# Patient Record
Sex: Male | Born: 1995 | Hispanic: Refuse to answer | Marital: Single | Smoking: Former smoker
Health system: Southern US, Community
[De-identification: ages and names within clinical notes are randomized; demographics above are authoritative.]

---

## 2017-11-22 ENCOUNTER — Encounter: Payer: Self-pay | Admitting: Family Medicine

## 2017-11-22 ENCOUNTER — Ambulatory Visit (INDEPENDENT_AMBULATORY_CARE_PROVIDER_SITE_OTHER): Payer: BLUE CROSS/BLUE SHIELD | Admitting: Family Medicine

## 2017-11-22 VITALS — BP 159/74 | HR 83 | Temp 99.1°F | Resp 14

## 2017-11-22 DIAGNOSIS — L089 Local infection of the skin and subcutaneous tissue, unspecified: Secondary | ICD-10-CM

## 2017-11-22 DIAGNOSIS — T148XXA Other injury of unspecified body region, initial encounter: Secondary | ICD-10-CM

## 2017-11-22 MED ORDER — SULFAMETHOXAZOLE-TRIMETHOPRIM 800-160 MG PO TABS: 1.0000 | ORAL_TABLET | Freq: Two times a day (BID) | ORAL | 0 refills | Status: DC

## 2017-11-22 NOTE — Addendum Note (Signed)
Addended by: Dione HousekeeperPATEL, Shenea Giacobbe N on: 11/22/2017 01:39 PM   Modules accepted: Orders

## 2017-11-22 NOTE — Progress Notes (Signed)
Patient presents today with symptoms of infected wound to left calf area. Patient states that he lacerated the area approximately a month ago. He was put on prophylactic antibiotics at that time. He is unsure of the medication that he was placed on. He states that he did skip a few doses of the medication. He started to notice the area becoming increasingly red over the last week or so he also has noticed some foul-smelling discharge from the area. He denies any fever, chills, rash, headache. Sutures were taken out by the trainer last week.  ROS: Negative except mentioned above. Vitals as per Epic. GENERAL: NAD SKIN: Left lower leg - there is approximately a 2 inch erythematous slightly raised wound on the left mid calf area, there is scabbing over the area, mild tenderness of the area minimal serosanguineous drainage expressed, no foreign body appreciated, no streaks noted, no inguinal lymphadenopathy appreciated NEURO: CN II-XII grossly intact   A/P: Left lower leg wound - the area was cultured, patient will get back to me about which antibiotic he was placed on before, will start him on another antibiotic at this time and wait for culture results, discussed with patient that depending on the culture results may have to change antibiotic, keep area dry and clean, keep area covered when in the weight room, keep covered if draining, seek medical attention I symptoms persist or worsen as discussed.

## 2017-11-25 LAB — WOUND CULTURE

## 2017-12-09 ENCOUNTER — Encounter: Payer: Self-pay | Admitting: Family Medicine

## 2017-12-09 ENCOUNTER — Ambulatory Visit (INDEPENDENT_AMBULATORY_CARE_PROVIDER_SITE_OTHER): Payer: BLUE CROSS/BLUE SHIELD | Admitting: Family Medicine

## 2017-12-09 VITALS — Temp 98.8°F | Resp 14

## 2017-12-09 DIAGNOSIS — N5089 Other specified disorders of the male genital organs: Secondary | ICD-10-CM

## 2017-12-09 DIAGNOSIS — N509 Disorder of male genital organs, unspecified: Secondary | ICD-10-CM

## 2017-12-12 ENCOUNTER — Ambulatory Visit
Admission: RE | Admit: 2017-12-12 | Discharge: 2017-12-12 | Disposition: A | Discharge status: Home / Self Care | Payer: BLUE CROSS/BLUE SHIELD | Source: Ambulatory Visit | Attending: Family Medicine | Admitting: Family Medicine

## 2017-12-12 DIAGNOSIS — N509 Disorder of male genital organs, unspecified: Secondary | ICD-10-CM | POA: Insufficient documentation

## 2017-12-12 DIAGNOSIS — N503 Cyst of epididymis: Secondary | ICD-10-CM | POA: Insufficient documentation

## 2017-12-12 DIAGNOSIS — N5089 Other specified disorders of the male genital organs: Secondary | ICD-10-CM

## 2017-12-13 ENCOUNTER — Other Ambulatory Visit: Payer: Self-pay | Admitting: Family Medicine

## 2017-12-13 ENCOUNTER — Ambulatory Visit (INDEPENDENT_AMBULATORY_CARE_PROVIDER_SITE_OTHER): Payer: BLUE CROSS/BLUE SHIELD | Admitting: Family Medicine

## 2017-12-13 VITALS — Temp 98.2°F | Resp 14

## 2017-12-13 DIAGNOSIS — L089 Local infection of the skin and subcutaneous tissue, unspecified: Secondary | ICD-10-CM

## 2017-12-13 MED ORDER — SULFAMETHOXAZOLE-TRIMETHOPRIM 800-160 MG PO TABS: 1.0000 | ORAL_TABLET | Freq: Two times a day (BID) | ORAL | 0 refills | Status: AC

## 2017-12-17 NOTE — Progress Notes (Signed)
Patient presents today with symptoms of a lesion felt in his right testicular area. Patient states that the area is not painful. He denies any penile discharge or any genital rash. He denies any risk of STDs. He does not think that the area has gotten larger in size. He is unsure about how long it has been there for. Patient states that the wound on his left calf area is healing. There has been no drainage from the site or pain. He has finished the antibiotics that were prescribed. He has been off of the antibiotics now for 3-4 days.  ROS: Negative except mentioned above.  GENERAL: NAD RESP: CTA B CARD: RRR GU: no genital lesions, no penile discharge, small pea-sized soft area noted in the right epididymis area, slightly tender (CMA in the room for exam) SKIN: area appears to be healing on left calf, no significant erythema or tenderness of the wound on the left calf, there is no discharge or streaks noted NEURO: CN II-XII grossly intact   A/P: Right GU Mass - will get a testicular ultrasound to evaluate further, likely benign, if patient would like I can send him to Urology. Patient has declined this at this time. Seek medical attention if any further symptoms.  * continue to monitor wound on left calf, if the area starts to drain again or becomes tender patient is to follow-up with me, may need surgical referral if this happens to evaluate for I&D of the area, etc.

## 2017-12-18 ENCOUNTER — Encounter: Payer: BLUE CROSS/BLUE SHIELD | Attending: Internal Medicine | Admitting: Internal Medicine

## 2017-12-18 DIAGNOSIS — W228XXA Striking against or struck by other objects, initial encounter: Secondary | ICD-10-CM | POA: Insufficient documentation

## 2017-12-18 DIAGNOSIS — S81812A Laceration without foreign body, left lower leg, initial encounter: Secondary | ICD-10-CM | POA: Diagnosis not present

## 2017-12-25 ENCOUNTER — Ambulatory Visit: Payer: BLUE CROSS/BLUE SHIELD | Admitting: Internal Medicine

## 2017-12-25 NOTE — Progress Notes (Signed)
Patient presents today after completing the antibiotics for his left leg wound. The area has started to drain again. He states that it has been close to a week since he finished the meds. He denies any fever or chills. The wound cx did grow Staph.  ROS: Negative except mentioned above. Vitals as per Epic GENERAL: NAD SKIN: minimal serosang. Drainage from the wound on left mid calf area, minimal tenderness around site, no streaks NEURO: CN II-XII grossly intact   A/P: Wound Infection Left Leg - unsure whether just needs longer course of medication or needs area to be I&D. Will make appointment for patient to see wound care for further evaluation/treatment. Will start patient on another week long course of antibiotics. Encouraged taking probiotic as well. Keep area clean and dry. Cover if draining. Seek medical attention if any worsening symptoms.

## 2018-01-08 NOTE — Progress Notes (Signed)
RANGEL, ECHEVERRI ([id number]) Visit Report for 12/18/2017 Abuse/Suicide Risk Screen Details Patient Name: Alan Mcdaniel, Alan Mcdaniel Date of Service: 12/18/2017 9:45 AM Medical Record Number: [id number] Patient Account Number: [id number] Date of Birth/Sex: 09-03-1995 (22 y.o. M) Treating RN: Huel Coventry Primary Care Alan Mcdaniel: Alan Mcdaniel Other Clinician: Referring Romelia Bromell: Alan Mcdaniel Treating Winfred Redel/Extender: Altamese [place] in Treatment: 0 Abuse/Suicide Risk Screen Items Answer ABUSE/SUICIDE RISK SCREEN: Has anyone close to you tried to hurt or harm you recentlyo No Do you feel uncomfortable with anyone in your familyo No Has anyone forced you do things that you didnot want to doo No Do you have any thoughts of harming yourselfo No Patient displays signs or symptoms of abuse and/or neglect. No Electronic Signature(s) Signed: 12/18/2017 5:47:31 PM By: Elliot Gurney, BSN, RN, CWS, Kim RN, BSN Signed: 01/08/2018 7:45:54 AM By: Arnette Norris Entered By: Arnette Norris on 12/18/2017 10:20:31 Alan Mcdaniel ([id number]) -------------------------------------------------------------------------------- Activities of Daily Living Details Patient Name: Alan Mcdaniel Date of Service: 12/18/2017 9:45 AM Medical Record Number: [id number] Patient Account Number: [id number] Date of Birth/Sex: 1996-01-30 (22 y.o. M) Treating RN: Huel Coventry Primary Care Daveon Arpino: Alan Mcdaniel Other Clinician: Referring Keiston Manley: Alan Mcdaniel Treating Starletta Houchin/Extender: Altamese [place] in Treatment: 0 Activities of Daily Living Items Answer Activities of Daily Living (Please select one for each item) Drive Automobile Completely Able Take Medications Completely Able Use Telephone Completely Able Care for Appearance Completely Able Use Toilet Completely Able Bath / Shower Completely Able Dress Self Completely Able Feed Self Completely Able Walk Completely Able Get In / Out Bed Completely Able Housework  Completely Able Prepare Meals Completely Able Handle Money Completely Able Shop for Self Completely Able Electronic Signature(s) Signed: 12/18/2017 5:47:31 PM By: Elliot Gurney, BSN, RN, CWS, Kim RN, BSN Signed: 01/08/2018 7:45:54 AM By: Arnette Norris Entered By: Arnette Norris on 12/18/2017 10:21:09 Alan Mcdaniel ([id number]) -------------------------------------------------------------------------------- Education Assessment Details Patient Name: Alan Mcdaniel Date of Service: 12/18/2017 9:45 AM Medical Record Number: [id number] Patient Account Number: [id number] Date of Birth/Sex: Jan 28, 1996 (22 y.o. M) Treating RN: Huel Coventry Primary Care Shelise Maron: Alan Mcdaniel Other Clinician: Referring Mattisyn Cardona: Alan Mcdaniel Treating Amiri Tritch/Extender: Altamese [place] in Treatment: 0 Learning Preferences/Education Level/Primary Language Learning Preference: Explanation Highest Education Level: College or Above Preferred Language: English Cognitive Barrier Assessment/Beliefs Language Barrier: No Translator Needed: No Memory Deficit: No Emotional Barrier: No Cultural/Religious Beliefs Affecting Medical Care: No Physical Barrier Assessment Impaired Vision: No Impaired Hearing: No Decreased Hand dexterity: No Knowledge/Comprehension Assessment Knowledge Level: High Comprehension Level: High Ability to understand written High instructions: Ability to understand verbal High instructions: Motivation Assessment Anxiety Level: Calm Cooperation: Cooperative Education Importance: Acknowledges Need Interest in Health Problems: Asks Questions Perception: Coherent Willingness to Engage in Self- High Management Activities: Readiness to Engage in Self- High Management Activities: Electronic Signature(s) Signed: 12/18/2017 5:47:31 PM By: Elliot Gurney, BSN, RN, CWS, Kim RN, BSN Signed: 01/08/2018 7:45:54 AM By: Arnette Norris Entered By: Arnette Norris on 12/18/2017 10:21:40 Alan Mcdaniel  ([id number]) -------------------------------------------------------------------------------- Fall Risk Assessment Details Patient Name: Alan Mcdaniel Date of Service: 12/18/2017 9:45 AM Medical Record Number: [id number] Patient Account Number: [id number] Date of Birth/Sex: 14-Jan-1996 (22 y.o. M) Treating RN: Huel Coventry Primary Care Lubna Stegeman: Alan Mcdaniel Other Clinician: Referring Ronen Bromwell: Alan Mcdaniel Treating Mattisyn Cardona/Extender: Altamese [place] in Treatment: 0 Fall Risk Assessment Items Have you had 2 or more falls in the last 12 monthso 0 No Have you had any fall that resulted in injury in the last 12 monthso 0 No FALL RISK ASSESSMENT: History of  falling - immediate or within 3 months 0 No Secondary diagnosis 0 No Ambulatory aid None/bed rest/wheelchair/nurse 0 No Crutches/cane/walker 0 No Furniture 0 No IV Access/Saline Lock 0 No Gait/Training Normal/bed rest/immobile 0 No Weak 0 No Impaired 0 No Mental Status Oriented to own ability 0 No Electronic Signature(s) Signed: 12/18/2017 5:47:31 PM By: Elliot Gurney, BSN, RN, CWS, Kim RN, BSN Signed: 01/08/2018 7:45:54 AM By: Arnette Norris Entered By: Arnette Norris on 12/18/2017 10:21:50 Alan Mcdaniel ([id number]) -------------------------------------------------------------------------------- Foot Assessment Details Patient Name: Alan Mcdaniel Date of Service: 12/18/2017 9:45 AM Medical Record Number: [id number] Patient Account Number: [id number] Date of Birth/Sex: 1996/08/03 (22 y.o. M) Treating RN: Huel Coventry Primary Care [place] Valli: Alan Mcdaniel Other Clinician: Referring Sorrel Cassetta: Alan Mcdaniel Treating Yeshua Stryker/Extender: Altamese [place] in Treatment: 0 Foot Assessment Items Site Locations + = Sensation present, - = Sensation absent, C = Callus, U = Ulcer R = Redness, W = Warmth, M = Maceration, PU = Pre-ulcerative lesion F = Fissure, S = Swelling, D = Dryness Assessment Right: Left: Other Deformity: No  No Prior Foot Ulcer: No No Prior Amputation: No No Charcot Joint: No No Ambulatory Status: Ambulatory Without Help Gait: Steady Electronic Signature(s) Signed: 12/18/2017 5:47:31 PM By: Elliot Gurney, BSN, RN, CWS, Kim RN, BSN Signed: 01/08/2018 7:45:54 AM By: Arnette Norris Entered By: Arnette Norris on 12/18/2017 10:38:15 Alan Mcdaniel ([id number]) -------------------------------------------------------------------------------- Nutrition Risk Assessment Details Patient Name: Alan Mcdaniel Date of Service: 12/18/2017 9:45 AM Medical Record Number: [id number] Patient Account Number: [id number] Date of Birth/Sex: 09-Aug-1996 (22 y.o. M) Treating RN: Huel Coventry Primary Care Arshawn Valdez: Alan Mcdaniel Other Clinician: Referring Lindzy Rupert: Alan Mcdaniel Treating Krew Hortman/Extender: Altamese [place] in Treatment: 0 Height (in): 70 Weight (lbs): 165 Body Mass Index (BMI): 23.7 Nutrition Risk Assessment Items NUTRITION RISK SCREEN: I have an illness or condition that made me change the kind and/or amount of 0 No food I eat I eat fewer than two meals per day 0 No I eat few fruits and vegetables, or milk products 0 No I have three or more drinks of beer, liquor or wine almost every day 0 No I have tooth or mouth problems that make it hard for me to eat 0 No I don't always have enough money to buy the food I need 0 No I eat alone most of the time 0 No I take three or more different prescribed or over-the-counter drugs a day 0 No Without wanting to, I have lost or gained 10 pounds in the last six months 0 No I am not always physically able to shop, cook and/or feed myself 0 No Nutrition Protocols Good Risk Protocol Moderate Risk Protocol Electronic Signature(s) Signed: 12/18/2017 5:47:31 PM By: Elliot Gurney, BSN, RN, CWS, Kim RN, BSN Signed: 01/08/2018 7:45:54 AM By: Arnette Norris Entered By: Arnette Norris on 12/18/2017 10:22:05

## 2018-01-08 NOTE — Progress Notes (Signed)
Alan Mcdaniel, Alan Mcdaniel ([id number]) Visit Report for 12/18/2017 Debridement Details Patient Name: Alan Mcdaniel, Alan Mcdaniel Date of Service: 12/18/2017 9:45 AM Medical Record Number: [id number] Patient Account Number: [id number] Date of Birth/Sex: 13-Apr-1996 (21 y.o. M) Treating RN: Huel Coventry Primary Care Provider: Jolene Provost Other Clinician: Referring Provider: Jolene Provost Treating Provider/Extender: Altamese [place] in Treatment: 0 Debridement Performed for Wound #1 Left,Medial Lower Leg Assessment: Performed By: Physician Maxwell Caul, MD Debridement Type: Debridement Pre-procedure Verification/Time Yes - 11:15 Out Taken: Start Time: 11:15 Pain Control: Other : lidocaine 4% Total Area Debrided (L x W): 0.4 (cm) x 0.5 (cm) = 0.2 (cm) Tissue and other material Non-Viable, Eschar debrided: Level: Non-Viable Tissue Debridement Description: Selective/Open Wound Instrument: Curette Bleeding: None Hemostasis Achieved: Pressure End Time: 11:18 Procedural Pain: 0 Post Procedural Pain: 0 Response to Treatment: Procedure was tolerated well Level of Consciousness: Awake and Alert Post Procedure Vitals: Temperature: 98.3 Pulse: 67 Respiratory Rate: 16 Blood Pressure: Systolic Blood Pressure: 151 Diastolic Blood Pressure: 73 Post Debridement Measurements of Total Wound Length: (cm) 0.7 Width: (cm) 1.7 Depth: (cm) 0.1 Volume: (cm) 0.093 Character of Wound/Ulcer Post Debridement: Stable Post Procedure Diagnosis Same as Pre-procedure Electronic Signature(s) Signed: 12/18/2017 5:47:31 PM By: Elliot Gurney, BSN, RN, CWS, Kim RN, BSN Signed: 12/19/2017 1:16:01 PM By: Baltazar Najjar MD [place] ([id number]) Entered By: Baltazar Najjar on 12/18/2017 12:33:27 Alan Mcdaniel ([id number]) -------------------------------------------------------------------------------- HPI Details Patient Name: Alan Mcdaniel Date of Service: 12/18/2017 9:45 AM Medical Record Number: [id number] Patient  Account Number: [id number] Date of Birth/Sex: 1995-12-03 (21 y.o. M) Treating RN: Huel Coventry Primary Care Provider: Jolene Provost Other Clinician: Referring Provider: Jolene Provost Treating Provider/Extender: Altamese [place] in Treatment: 0 History of Present Illness HPI Description: 12/19/17; this is a otherwise healthy 22 year old man who was fishing in [place] on 11/04/17. He climbed out of the water and suffered a laceration on his left medial calf on coral that was adherent to the latter. He went to emergency. The area was sutured and antibiotics were prescribed. The sutures were removed 11 days later and there was a wound dehiscence. He is able to show me a picture of the initial wound which was really quite long and of sizable depth down and the muscle. In any case this is largely closed down. He received 2 courses of antibiotics which included doxycycline and then trimethoprim/sulfamethoxazole. He is completing the latter in 2 days' time. He saw his primary doctor recently who did a culture but did not alter the antibiotics. He does not have a wound history. He is not a diabetic and is not on any relevant medications. 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 M) Treating RN: Huel Coventry Primary Care Provider: Jolene Provost Other Clinician: Referring Provider: Jolene Provost Treating Provider/Extender: Altamese [place] in Treatment: 0 Constitutional Patient is hypertensive.. Pulse  regular and within target range for patient.Marland Kitchen Respirations regular, non-labored and within target range.. Temperature is normal and within the target range for the patient.Marland Kitchen appears in no distress. Eyes Conjunctivae clear. No discharge. Respiratory Respiratory effort is easy and symmetric bilaterally. Rate is normal at rest and on room air.. Cardiovascular Pedal  pulses palpable and strong bilaterally.. there is no edema. Lymphatic none palpable in the popliteal area bilaterally. Psychiatric No evidence of depression, anxiety, or agitation. Calm, cooperative, and communicative. Appropriate interactions and affect.. Notes wound exam; his original wound is obviously dramatically improved. Most of this is healed with adherent fresh tissue. He had a slight amount of eschar in 2 places which I removed with a #3 curet he has 2 small open areas here which I think will probably close quite easily. Electronic Signature(s) Signed: 12/19/2017 1:16:01 PM By: Baltazar Najjar MD Entered By: Baltazar Najjar on 12/18/2017 12:37:49 Alan Mcdaniel ([id number]) -------------------------------------------------------------------------------- Physician Orders Details Patient Name: Alan Mcdaniel Date of Service: 12/18/2017 9:45 AM Medical Record Number: [id number] Patient Account Number: [id number] Date of Birth/Sex: 1996-03-06 (21 y.o. M) Treating RN: Huel Coventry Primary Care Provider: Jolene Provost Other Clinician: Referring Provider: Jolene Provost Treating Provider/Extender: Altamese [place] in Treatment: 0 Verbal / Phone Orders: No Diagnosis Coding Wound Cleansing Wound #1 Left,Medial Lower Leg o Clean wound with Normal Saline. Anesthetic (add to Medication List) Wound #1 Left,Medial Lower Leg o Topical Lidocaine 4% cream applied to wound bed prior to debridement (In Clinic Only). Primary Wound Dressing Wound #1 Left,Medial Lower Leg o Silver Collagen Secondary Dressing Wound #1  Left,Medial Lower Leg o Boardered Foam Dressing Dressing Change Frequency Wound #1 Left,Medial Lower Leg o Change dressing every other day. Follow-up Appointments Wound #1 Left,Medial Lower Leg o Return Appointment in 1 week. Electronic Signature(s) Signed: 12/18/2017 5:47:31 PM By: Elliot Gurney, BSN, RN, CWS, Kim RN, BSN Signed: 12/19/2017 1:16:01 PM By: Baltazar Najjar MD Entered By: Elliot Gurney, BSN, RN, CWS, Kim on 12/18/2017 11:25:19 Alan Mcdaniel, Alan Mcdaniel ([id number]) -------------------------------------------------------------------------------- Problem List Details Patient Name: Alan Mcdaniel Date of Service: 12/18/2017 9:45 AM Medical Record Number: [id number] Patient Account Number: [id number] Date of Birth/Sex: [birth year] (21 y.o. M) Treating RN: Huel Coventry Primary Care Provider: Jolene Provost Other Clinician: Referring Provider: Jolene Provost Treating Provider/Extender: Altamese [place] in Treatment: 0 Active Problems ICD-10 Impacting Encounter Code Description Active Date Wound Healing Diagnosis S81.812D Laceration without foreign body, left lower leg, subsequent 12/18/2017 Yes encounter Inactive Problems Resolved Problems Electronic Signature(s) Signed: 12/19/2017 1:16:01 PM By: Baltazar Najjar MD Entered By: Baltazar Najjar on 12/18/2017 12:32:59 Alan Mcdaniel ([id number]) -------------------------------------------------------------------------------- Progress Note Details Patient Name: Alan Mcdaniel Date of Service: 12/18/2017 9:45 AM Medical Record Number: [id number] Patient Account Number: [id number] Date of Birth/Sex: Sep 05, 1995 (21 y.o. M) Treating RN: Huel Coventry Primary Care Provider: Jolene Provost Other Clinician: Referring Provider: Jolene Provost Treating Provider/Extender: Altamese [place] in Treatment: 0 Subjective History of Present Illness (HPI) 12/19/17; this is a otherwise healthy 22 year old man who was fishing in [place] on 11/04/17. He  climbed out of the water and suffered a laceration on his left medial calf on coral that was adherent to the latter. He went to emergency. The area was sutured and antibiotics were prescribed. The sutures were removed 11 days later and there was a wound dehiscence. He is able to show me a picture of the initial wound which was really quite long and of sizable depth down and the muscle. In any case this is largely closed down. He received 2 courses of antibiotics which included doxycycline and then trimethoprim/sulfamethoxazole. He is completing the latter in 2 days' time. He saw his primary doctor recently who did a culture but did not alter the antibiotics. He does not have a wound history. He is not a diabetic and is not on any relevant  medications. 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 Patient has been treating wound in the following manner: antibiotics, antibiotic cream. Laboratory tests have been performed in the last month. Patient reportedly has not tested positive for an antibiotic resistant organism. Patient reportedly has not tested positive for osteomyelitis. Patient reportedly has not had testing performed to evaluate circulation in the legs. Patient History Allergies no known allergies Family History Cancer - Maternal Grandparents,Paternal Grandparents, No family history of Diabetes, Heart Disease, Hereditary Spherocytosis, Hypertension, Kidney Disease, Lung Disease, Seizures, Stroke, Thyroid Problems, Tuberculosis. Social History Never smoker, Marital Status - Single, Alcohol Use - Moderate, Drug Use - No History, Caffeine Use - Daily. Review of Systems (ROS) Constitutional Symptoms (General Health) Denies complaints or symptoms of Fatigue, Fever, Chills, Marked Weight Change. Eyes Denies complaints or symptoms  of Dry Eyes, Vision Changes, Glasses / Contacts. Ear/Nose/Mouth/Throat Denies complaints or symptoms of Difficult clearing ears, Sinusitis. Hematologic/Lymphatic Denies complaints or symptoms of Bleeding / Clotting Disorders, Human Immunodeficiency Virus. Respiratory Denies complaints or symptoms of Chronic or frequent coughs, Shortness of Breath. Cardiovascular Denies complaints or symptoms of Chest pain, LE edema. Gastrointestinal Denies complaints or symptoms of Frequent diarrhea, Nausea, Vomiting. Endocrine Denies complaints or symptoms of Hepatitis, Thyroid disease, Polydypsia (Excessive Thirst). Alan Mcdaniel, Alan Mcdaniel ([id number]) Genitourinary Denies complaints or symptoms of Kidney failure/ Dialysis, Incontinence/dribbling. Immunological Denies complaints or symptoms of Hives, Itching. Integumentary (Skin) Denies complaints or symptoms of Wounds, Bleeding or bruising tendency, Breakdown, Swelling. Musculoskeletal Denies complaints or symptoms of Muscle Pain, Muscle Weakness. Neurologic Denies complaints or symptoms of Numbness/parasthesias, Focal/Weakness. Psychiatric Denies complaints or symptoms of Anxiety, Claustrophobia. Objective Constitutional Patient is hypertensive.. Pulse regular and within target range for patient.Marland Kitchen Respirations regular, non-labored and within target range.. Temperature is normal and within the target range for the patient.Marland Kitchen appears in no distress. Vitals Time Taken: 10:07 AM, Height: 70 in, Source: Stated, Weight: 165 lbs, Source: Stated, BMI: 23.7, Temperature: 98.7  F, Pulse: 104 bpm, Respiratory Rate: 16 breaths/min, Blood Pressure: 159/85 mmHg. Eyes Conjunctivae clear. No discharge. Respiratory Respiratory effort is easy and symmetric bilaterally. Rate is normal at rest and on room air.. Cardiovascular Pedal pulses palpable and strong bilaterally.. there is no edema. Lymphatic none palpable in the popliteal area bilaterally. Psychiatric No  evidence of depression, anxiety, or agitation. Calm, cooperative, and communicative. Appropriate interactions and affect.. General Notes: wound exam; his original wound is obviously dramatically improved. Most of this is healed with adherent fresh tissue. He had a slight amount of eschar in 2 places which I removed with a #3 curet he has 2 small open areas here which I think will probably close quite easily. Integumentary (Hair, Skin) Wound #1 status is Open. Original cause of wound was Trauma. The wound is located on the Left,Medial Lower Leg. The wound measures 0.4cm length x 4cm width x 0.1cm depth; 1.257cm^2 area and 0.126cm^3 volume. There is no tunneling or undermining noted. There is a none present amount of drainage noted. The wound margin is flat and intact. There is large (67-100%) pink granulation within the wound bed. There is no necrotic tissue within the wound bed. The periwound skin appearance exhibited: Dry/Scaly. The periwound skin appearance did not exhibit: Callus, Crepitus, Excoriation, Induration, Rash, Scarring, Maceration, Atrophie Blanche, Cyanosis, Ecchymosis, Hemosiderin Staining, Mottled, Pallor, Rubor, [place], Able ([id number]) Erythema. Periwound temperature was  noted as No Abnormality. Assessment Active Problems ICD-10 S81.812D - Laceration without foreign body, left lower leg, subsequent encounter Procedures Wound #1 Pre-procedure diagnosis of Wound #1 is a Trauma, Other located on the Left,Medial Lower Leg . There was a Selective/Open Wound Non-Viable Tissue Debridement with a total area of 0.2 sq cm performed by Maxwell Caul, MD. With the following instrument(s): Curette. to remove Non-Viable tissue/material Material removed includes Eschar after achieving pain control using Other (lidocaine 4%). No specimens were taken. A time out was conducted at 11:15, prior to the start of the procedure. There was no bleeding. The procedure was tolerated well with a  pain level of 0 throughout and a pain level of 0 following the procedure. Patient s Level of Consciousness post procedure was recorded as Awake and Alert and post- procedure vitals were taken including Temperature: 98.3 F, Pulse: 67 bpm, Respiratory Rate: 16 breaths/min, Blood Pressure: (151)/(73) mmHg. Post Debridement Measurements: 0.7cm length x 1.7cm width x 0.1cm depth; 0.093cm^3 volume. Character of Wound/Ulcer Post Debridement is stable. Post procedure Diagnosis Wound #1: Same as Pre-Procedure Plan Wound Cleansing: Wound #1 Left,Medial Lower Leg: Clean wound with Normal Saline. Anesthetic (add to Medication List): Wound #1 Left,Medial Lower Leg: Topical Lidocaine 4% cream applied to wound bed prior to debridement (In Clinic Only). Primary Wound Dressing: Wound #1 Left,Medial Lower Leg: Silver Collagen Secondary Dressing: Wound #1 Left,Medial Lower Leg: Boardered Foam Dressing Dressing Change Frequency: Wound #1 Left,Medial Lower Leg: Change dressing every other day. Follow-up Appointments: Wound #1 Left,Medial Lower Leg: Return Appointment in 1 week. Alan Mcdaniel, Alan Mcdaniel ([id number]) #1. Post-debridement we put collagen on this wound and border foam the patient will change this daily to every second day #2 given an appointment in a week but truthfully I think this will be healed. #3 I gather there was significant coexisting infection around this wound at one point indeed he is just finishing a course of Septra now. I see no current evidence of infection, there was nothing that needed culturing and I did not alter the antibiotics that he is finishing in the next day or 2 Electronic Signature(s) Signed: 12/19/2017 1:16:01 PM By: Baltazar Najjar MD Entered By: Baltazar Najjar on 12/18/2017 12:39:21 Alan Mcdaniel ([id number]) -------------------------------------------------------------------------------- ROS/PFSH Details Patient Name: Alan Mcdaniel Date of Service: 12/18/2017 9:45  AM Medical Record Number: [id number] Patient Account Number: [id number] Date of Birth/Sex: 06-Dec-1995 (21 y.o. M) Treating RN: Huel Coventry Primary Care Provider: Jolene Provost Other Clinician: Referring Provider: Jolene Provost Treating Provider/Extender: Altamese [place] in Treatment: 0 Wound History Do you currently have one or more open woundso Yes How many open wounds do you currently haveo 1 Approximately how long have you had your woundso 1 1/2 months How have you been treating your wound(s) until nowo antibiotics, antibiotic cream Has your wound(s) ever healed and then re-openedo No Have you had any lab work done in the past montho Yes Who ordered the lab work doneo Dr. Allena Katz Have you tested positive for an antibiotic resistant organism (MRSA, VRE)o No Have you tested positive for osteomyelitis (bone infection)o No Have you had any tests for circulation on your legso No Constitutional Symptoms (General Health) Complaints and Symptoms: Negative for: Fatigue; Fever; Chills; Marked Weight Change Eyes Complaints and Symptoms: Negative for: Dry Eyes; Vision Changes; Glasses / Contacts Ear/Nose/Mouth/Throat Complaints and Symptoms: Negative for: Difficult clearing ears; Sinusitis Hematologic/Lymphatic Complaints and Symptoms: Negative for: Bleeding / Clotting Disorders; Human Immunodeficiency Virus Respiratory Complaints and Symptoms: Negative for: Chronic or frequent  coughs; Shortness of Breath Cardiovascular Complaints and Symptoms: Negative for: Chest pain; LE edema Gastrointestinal Complaints and Symptoms: Negative for: Frequent diarrhea; Nausea; Vomiting Endocrine CEBERT, DETTMANN ([id number]) Complaints and Symptoms: Negative for: Hepatitis; Thyroid disease; Polydypsia (Excessive Thirst) Genitourinary Complaints and Symptoms: Negative for: Kidney failure/ Dialysis; Incontinence/dribbling Immunological Complaints and Symptoms: Negative for: Hives;  Itching Integumentary (Skin) Complaints and Symptoms: Negative for: Wounds; Bleeding or bruising tendency; Breakdown; Swelling Musculoskeletal Complaints and Symptoms: Negative for: Muscle Pain; Muscle Weakness Neurologic Complaints and Symptoms: Negative for: Numbness/parasthesias; Focal/Weakness Psychiatric Complaints and Symptoms: Negative for: Anxiety; Claustrophobia Immunizations Pneumococcal Vaccine: Received Pneumococcal Vaccination: No Tetanus Vaccine: Last tetanus shot: 11/02/2017 Implantable Devices Family and Social History Cancer: Yes - Maternal Grandparents,Paternal Grandparents; Diabetes: No; Heart Disease: No; Hereditary Spherocytosis: No; Hypertension: No; Kidney Disease: No; Lung Disease: No; Seizures: No; Stroke: No; Thyroid Problems: No; Tuberculosis: No; Never smoker; Marital Status - Single; Alcohol Use: Moderate; Drug Use: No History; Caffeine Use: Daily; Financial Concerns: No; Food, Clothing or Shelter Needs: No; Support System Lacking: No; Transportation Concerns: No; Advanced Directives: No; Living Will: No Electronic Signature(s) Signed: 12/18/2017 5:47:31 PM By: Elliot Gurney, BSN, RN, CWS, Kim RN, BSN Signed: 12/19/2017 1:16:01 PM By: Baltazar Najjar MD Signed: 01/08/2018 7:45:54 AM By: Arnette Norris Entered By: Arnette Norris on 12/18/2017 10:20:03 Alan Mcdaniel ([id number]) -------------------------------------------------------------------------------- SuperBill Details Patient Name: Alan Mcdaniel Date of Service: 12/18/2017 Medical Record Number: [id number] Patient Account Number: [id number] Date of Birth/Sex: 09-22-1995 (21 y.o. M) Treating RN: Huel Coventry Primary Care Provider: Jolene Provost Other Clinician: Referring Provider: Jolene Provost Treating Provider/Extender: Maxwell Caul [place] in Treatment: 0 Diagnosis Coding ICD-10 Codes Code Description [phone] Laceration without foreign body, left lower leg, subsequent encounter Facility  Procedures CPT4 Code: 13244010 Description: 99213 - WOUND CARE VISIT-LEV 3 EST PT Modifier: Quantity: 1 CPT4 Code: [id number] Description: 97597 - DEBRIDE WOUND 1ST 20 SQ CM OR < ICD-10 Diagnosis Description S81.812D Laceration without foreign body, left lower leg, subsequent Modifier: encounter Quantity: 1 Physician Procedures CPT4 Code: [id number] Description: 99202 - WC PHYS LEVEL 2 - NEW PT ICD-10 Diagnosis Description S81.812D Laceration without foreign body, left lower leg, subsequent Modifier: 25 encounter Quantity: 1 CPT4 Code: [id number] Description: 97597 - WC PHYS DEBR WO ANESTH 20 SQ CM ICD-10 Diagnosis Description S81.812D Laceration without foreign body, left lower leg, subsequent Modifier: encounter Quantity: 1 Electronic Signature(s) Signed: 12/19/2017 1:16:01 PM By: Baltazar Najjar MD Entered By: Baltazar Najjar on 12/18/2017 12:44:14

## 2018-01-08 NOTE — Progress Notes (Signed)
MARIBEL, LUIS ([id number]) Visit Report for 12/18/2017 Allergy List Details Patient Name: Alan, Mcdaniel Date of Service: 12/18/2017 9:45 AM Medical Record Number: [id number] Patient Account Number: [id number] Date of Birth/Sex: 12/05/1995 (21 y.o. M) Treating RN: Huel Coventry Primary Care Louretta Tantillo: Jolene Provost Other Clinician: Referring Addalie Calles: Jolene Provost Treating Raegan Sipp/Extender: Maxwell Caul [place] in Treatment: 0 Allergies Active Allergies no known allergies Allergy Notes Electronic Signature(s) Signed: 01/08/2018 7:45:54 AM By: Arnette Norris Entered By: Arnette Norris on 12/18/2017 10:13:50 Alan Mcdaniel ([id number]) -------------------------------------------------------------------------------- Arrival Information Details Patient Name: Alan Mcdaniel Date of Service: 12/18/2017 9:45 AM Medical Record Number: [id number] Patient Account Number: [id number] Date of Birth/Sex: [birth year] (21 y.o. M) Treating RN: Huel Coventry Primary Care Kinaya Hilliker: Jolene Provost Other Clinician: Referring Kalika Smay: Jolene Provost Treating Vianne Grieshop/Extender: Altamese [place] in Treatment: 0 Visit Information Patient Arrived: Ambulatory Arrival Time: 10:04 Accompanied By: self Transfer Assistance: None Patient Identification Verified: Yes Secondary Verification Process Completed: Yes Electronic Signature(s) Signed: 01/08/2018 7:45:54 AM By: Arnette Norris Entered By: Arnette Norris on 12/18/2017 10:06:48 Alan Mcdaniel ([id number]) -------------------------------------------------------------------------------- Clinic Level of Care Assessment Details Patient Name: Alan Mcdaniel Date of Service: 12/18/2017 9:45 AM Medical Record Number: [id number] Patient Account Number: [id number] Date of Birth/Sex: [birth year] (21 y.o. M) Treating RN: Huel Coventry Primary Care Alany Borman: Jolene Provost Other Clinician: Referring Ranald Alessio: Jolene Provost Treating Adriene Padula/Extender: Altamese [place] in Treatment: 0 Clinic Level of Care Assessment Items TOOL 1 Quantity Score  - Use when EandM and Procedure is performed on INITIAL visit 0 ASSESSMENTS - Nursing Assessment / Reassessment X - General Physical Exam (combine w/ comprehensive assessment (listed just below) when 1 20 performed on new pt. evals) X- 1 25 Comprehensive Assessment (HX, ROS, Risk Assessments, Wounds Hx, etc.) ASSESSMENTS - Wound and Skin Assessment / Reassessment  - Dermatologic / Skin Assessment (not related to wound area) 0 ASSESSMENTS - Ostomy and/or Continence Assessment and Care  - Incontinence Assessment and Management 0  - 0 Ostomy Care Assessment and Management (repouching, etc.) PROCESS - Coordination of Care X - Simple Patient / Family Education for ongoing care 1 15  - 0 Complex (extensive) Patient / Family Education for ongoing care X- 1 10 Staff obtains Chiropractor, Records, Test Results / Process Orders  - 0 Staff telephones HHA, Nursing Homes / Clarify orders / etc  - 0 Routine Transfer to another Facility (non-emergent condition)  - 0 Routine Hospital Admission (non-emergent condition)  - 0 New Admissions / Manufacturing engineer / Ordering NPWT, Apligraf, etc.  - 0 Emergency Hospital Admission (emergent condition) PROCESS - Special Needs  - Pediatric / Minor Patient Management 0  - 0 Isolation Patient Management  - 0 Hearing / Language / Visual special needs  - 0 Assessment of Community assistance (transportation, D/C planning, etc.)  - 0 Additional assistance / Altered mentation  - 0 Support Surface(s) Assessment (bed, cushion, seat, etc.) [place], Alan Mcdaniel ([id number]) INTERVENTIONS - Miscellaneous  - External ear exam 0  - 0 Patient Transfer (multiple staff / Nurse, adult / Similar devices)  - 0 Simple Staple / Suture removal (25 or less)  - 0 Complex Staple / Suture removal (26 or more)  - 0 Hypo/Hyperglycemic Management (do  not check if billed separately) X- 1 15 Ankle / Brachial Index (ABI) - do not check if billed separately Has the patient been seen at the hospital within the last three years: Yes Total Score: 85 Level Of Care: New/Established - Level 3 Electronic Signature(s) Signed: 12/18/2017 5:47:31 PM By: Elliot Gurney,  BSN, RN, CWS, Kim RN, BSN Entered By: Elliot Gurney, BSN, RN, CWS, Kim on 12/18/2017 11:26:01 Alan Mcdaniel ([id number]) -------------------------------------------------------------------------------- Encounter Discharge Information Details Patient Name: Alan Mcdaniel Date of Service: 12/18/2017 9:45 AM Medical Record Number: [id number] Patient Account Number: [id number] Date of Birth/Sex: 1996/08/12 (21 y.o. M) Treating RN: Huel Coventry Primary Care Emanuelle Hammerstrom: Jolene Provost Other Clinician: Referring Keefe Zawistowski: Jolene Provost Treating Arhianna Ebey/Extender: Altamese [place] in Treatment: 0 Encounter Discharge Information Items Discharge Condition: Stable Ambulatory Status: Ambulatory Discharge Destination: Home Transportation: Private Auto Accompanied By: self Schedule Follow-up Appointment: Yes Clinical Summary of Care: Electronic Signature(s) Signed: 01/08/2018 7:45:54 AM By: Arnette Norris Entered By: Arnette Norris on 12/18/2017 11:28:59 Alan Mcdaniel ([id number]) -------------------------------------------------------------------------------- Lower Extremity Assessment Details Patient Name: Alan Mcdaniel Date of Service: 12/18/2017 9:45 AM Medical Record Number: [id number] Patient Account Number: [id number] Date of Birth/Sex: November 05, 1995 (21 y.o. M) Treating RN: Huel Coventry Primary Care Kyrie Bun: Jolene Provost Other Clinician: Referring Lamarco Gudiel: Jolene Provost Treating Esra Frankowski/Extender: Altamese [place] in Treatment: 0 Edema Assessment Assessed: [Left: No] [Right: No] Edema: [Left: N] [Right: o] Calf Left: Right: Point of Measurement: 35 cm From Medial Instep 35.5 cm  cm Ankle Left: Right: Point of Measurement: 12 cm From Medial Instep 21 cm cm Vascular Assessment Pulses: Dorsalis Pedis Palpable: [Left:Yes] Posterior Tibial Palpable: [Left:Yes] Popliteal Palpable: [Left:Yes] Extremity colors, hair growth, and conditions: Extremity Color: [Left:Normal] Hair Growth on Extremity: [Left:Yes] Temperature of Extremity: [Left:Warm] Capillary Refill: [Left:< 3 seconds] Dependent Rubor: [Left:No] Blanched when Elevated: [Left:No] Lipodermatosclerosis: [Left:No] Blood Pressure: Brachial: [Left:130] Dorsalis Pedis: 120 [Left:Dorsalis Pedis:] Ankle: Posterior Tibial: 160 [Left:Posterior Tibial: 1.23] Toe Nail Assessment Left: Right: Thick: No Discolored: No Deformed: No Improper Length and Hygiene: No Electronic Signature(sANTONIN, Alan Mcdaniel ([id number]) Signed: 12/18/2017 5:47:31 PM By: Elliot Gurney, BSN, RN, CWS, Kim RN, BSN Signed: 01/08/2018 7:45:54 AM By: Arnette Norris Entered By: Arnette Norris on 12/18/2017 10:37:33 Alan Mcdaniel ([id number]) -------------------------------------------------------------------------------- Multi Wound Chart Details Patient Name: Alan Mcdaniel Date of Service: 12/18/2017 9:45 AM Medical Record Number: [id number] Patient Account Number: [id number] Date of Birth/Sex: 05/27/1996 (21 y.o. M) Treating RN: Huel Coventry Primary Care Laketha Leopard: Jolene Provost Other Clinician: Referring Sri Clegg: Jolene Provost Treating Kiani Wurtzel/Extender: Altamese [place] in Treatment: 0 Vital Signs Height(in): 70 Pulse(bpm): 104 Weight(lbs): 165 Blood Pressure(mmHg): 159/85 Body Mass Index(BMI): 24 Temperature(F): 98.7 Respiratory Rate 16 (breaths/min): Photos: [1:No Photos] [N/A:N/A] Wound Location: [1:Left Lower Leg - Medial] [N/A:N/A] Wounding Event: [1:Trauma] [N/A:N/A] Primary Etiology: [1:Trauma, Other] [N/A:N/A] Date Acquired: [1:11/02/2017] [N/A:N/A] Weeks of Treatment: [1:0] [N/A:N/A] Wound Status: [1:Open]  [N/A:N/A] Measurements L x W x D [1:0.4x4x0.1] [N/A:N/A] (cm) Area (cm) : [1:1.257] [N/A:N/A] Volume (cm) : [1:0.126] [N/A:N/A] % Reduction in Area: [1:0.00%] [N/A:N/A] % Reduction in Volume: [1:0.00%] [N/A:N/A] Classification: [1:Partial Thickness] [N/A:N/A] Exudate Amount: [1:None Present] [N/A:N/A] Wound Margin: [1:Flat and Intact] [N/A:N/A] Granulation Amount: [1:Large (67-100%)] [N/A:N/A] Granulation Quality: [1:Pink] [N/A:N/A] Necrotic Amount: [1:None Present (0%)] [N/A:N/A] Exposed Structures: [1:Fascia: No Fat Layer (Subcutaneous Tissue) Exposed: No Tendon: No Muscle: No Joint: No Bone: No] [N/A:N/A] Epithelialization: [1:Large (67-100%)] [N/A:N/A] Debridement: [1:Debridement - Selective/Open Wound] [N/A:N/A] Pre-procedure [1:11:15] [N/A:N/A] Verification/Time Out Taken: Pain Control: [1:Other] [N/A:N/A] Tissue Debrided: [1:Necrotic/Eschar] [N/A:N/A] Level: [1:Non-Viable Tissue] [N/A:N/A] Debridement Area (sq cm): [1:0.2] [N/A:N/A] Instrument: [1:Curette] [N/A:N/A] Bleeding: [1:None] [N/A:N/A] Hemostasis Achieved: Pressure N/A N/A Procedural Pain: 0 N/A N/A Post Procedural Pain: 0 N/A N/A Debridement Treatment Procedure was tolerated well N/A N/A Response: Post Debridement 0.7x1.7x0.1 N/A N/A Measurements L x W x D (cm) Post Debridement Volume: 0.093 N/A N/A (cm)  Periwound Skin Texture: Excoriation: No N/A N/A Induration: No Callus: No Crepitus: No Rash: No Scarring: No Periwound Skin Moisture: Dry/Scaly: Yes N/A N/A Maceration: No Periwound Skin Color: Atrophie Blanche: No N/A N/A Cyanosis: No Ecchymosis: No Erythema: No Hemosiderin Staining: No Mottled: No Pallor: No Rubor: No Temperature: No Abnormality N/A N/A Tenderness on Palpation: No N/A N/A Wound Preparation: Ulcer Cleansing: N/A N/A Rinsed/Irrigated with Saline Topical Anesthetic Applied: Other: lidocaine 4% Procedures Performed: Debridement N/A N/A Treatment Notes Wound #1 (Left,  Medial Lower Leg) 1. Cleansed with: Clean wound with Normal Saline 2. Anesthetic Topical Lidocaine 4% cream to wound bed prior to debridement 4. Dressing Applied: Prisma Ag 5. Secondary Dressing Applied Bordered Foam Dressing Electronic Signature(s) Signed: 12/19/2017 1:16:01 PM By: Baltazar Najjar MD Entered By: Baltazar Najjar on 12/18/2017 12:33:11 Alan Mcdaniel ([id number]) -------------------------------------------------------------------------------- Multi-Disciplinary Care Plan Details Patient Name: Alan Mcdaniel Date of Service: 12/18/2017 9:45 AM Medical Record Number: [id number] Patient Account Number: [id number] Date of Birth/Sex: 1996/07/03 (21 y.o. M) Treating RN: Huel Coventry Primary Care Kensley Lares: Jolene Provost Other Clinician: Referring Safiyya Stokes: Jolene Provost Treating Ingeborg Fite/Extender: Altamese [place] in Treatment: 0 Active Inactive Electronic Signature(s) Signed: 12/26/2017 3:31:59 PM By: Elliot Gurney, BSN, RN, CWS, Kim RN, BSN Previous Signature: 12/18/2017 5:47:31 PM Version By: Elliot Gurney, BSN, RN, CWS, Kim RN, BSN Entered By: Elliot Gurney, BSN, RN, CWS, Kim on 12/26/2017 15:31:57 Alan Mcdaniel ([id number]) -------------------------------------------------------------------------------- Pain Assessment Details Patient Name: Alan Mcdaniel Date of Service: 12/18/2017 9:45 AM Medical Record Number: [id number] Patient Account Number: [id number] Date of Birth/Sex: April 06, 1996 (21 y.o. M) Treating RN: Huel Coventry Primary Care Alaine Loughney: Jolene Provost Other Clinician: Referring Meklit Cotta: Jolene Provost Treating Azelia Reiger/Extender: Altamese [place] in Treatment: 0 Active Problems Location of Pain Severity and Description of Pain Patient Has Paino No Site Locations Pain Management and Medication Current Pain Management: Electronic Signature(s) Signed: 12/18/2017 5:47:31 PM By: Elliot Gurney, BSN, RN, CWS, Kim RN, BSN Signed: 01/08/2018 7:45:54 AM By: Arnette Norris Entered By:  Arnette Norris on 12/18/2017 10:07:05 Alan Mcdaniel ([id number]) -------------------------------------------------------------------------------- Patient/Caregiver Education Details Patient Name: Alan Mcdaniel Date of Service: 12/18/2017 9:45 AM Medical Record Number: [id number] Patient Account Number: [id number] Date of Birth/Gender: 1996-06-18 (21 y.o. M) Treating RN: Huel Coventry Primary Care Physician: Jolene Provost Other Clinician: Referring Physician: Jolene Provost Treating Physician/Extender: Altamese [place] in Treatment: 0 Education Assessment Education Provided To: Patient Education Topics Provided [facility]: Handouts: [facility] Methods: Explain/Verbal Responses: State content correctly Wound Debridement: Handouts: Wound Debridement Methods: Explain/Verbal Responses: State content correctly Wound/Skin Impairment: Handouts: Caring for Your Ulcer Methods: Explain/Verbal Responses: State content correctly Electronic Signature(s) Signed: 01/08/2018 7:45:54 AM By: Arnette Norris Entered By: Arnette Norris on 12/18/2017 11:29:46 Alan Mcdaniel ([id number]) -------------------------------------------------------------------------------- Wound Assessment Details Patient Name: Alan Mcdaniel Date of Service: 12/18/2017 9:45 AM Medical Record Number: [id number] Patient Account Number: [id number] Date of Birth/Sex: [birth year] (21 y.o. M) Treating RN: Huel Coventry Primary Care Valdis Bevill: Jolene Provost Other Clinician: Referring Miyani Cronic: Jolene Provost Treating Alailah Safley/Extender: Altamese [place] in Treatment: 0 Wound Status Wound Number: 1 Primary Etiology: Trauma, Other Wound Location: Left Lower Leg - Medial Wound Status: Open Wounding Event: Trauma Date Acquired: 11/02/2017 Weeks Of Treatment: 0 Clustered Wound: No Photos Photo Uploaded By: Arnette Norris on 12/18/2017 16:29:17 Wound Measurements Length: (cm)  0.4 Width: (cm) 4 Depth: (cm) 0.1 Area: (cm) 1.257 Volume: (cm) 0.126 % Reduction in Area: 0% % Reduction in Volume: 0% Epithelialization: Large (67-100%) Tunneling: No Undermining: No Wound  Description Classification: Partial Thickness Wound Margin: Flat and Intact Exudate Amount: None Present Foul Odor After Cleansing: No Wound Bed Granulation Amount: Large (67-100%) Exposed Structure Granulation Quality: Pink Fascia Exposed: No Necrotic Amount: None Present (0%) Fat Layer (Subcutaneous Tissue) Exposed: No Tendon Exposed: No Muscle Exposed: No Joint Exposed: No Bone Exposed: No Periwound Skin Texture Texture Color No Abnormalities Noted: No No Abnormalities Noted: No Alan Mcdaniel, Alan Mcdaniel ([id number]) Callus: No Atrophie Blanche: No Crepitus: No Cyanosis: No Excoriation: No Ecchymosis: No Induration: No Erythema: No Rash: No Hemosiderin Staining: No Scarring: No Mottled: No Pallor: No Moisture Rubor: No No Abnormalities Noted: No Dry / Scaly: Yes Temperature / Pain Maceration: No Temperature: No Abnormality Wound Preparation Ulcer Cleansing: Rinsed/Irrigated with Saline Topical Anesthetic Applied: Other: lidocaine 4%, Electronic Signature(s) Signed: 12/18/2017 5:47:31 PM By: Elliot Gurney, BSN, RN, CWS, Kim RN, BSN Signed: 01/08/2018 7:45:54 AM By: Arnette Norris Entered By: Arnette Norris on 12/18/2017 10:28:40 Alan Mcdaniel ([id number]) -------------------------------------------------------------------------------- Vitals Details Patient Name: Alan Mcdaniel Date of Service: 12/18/2017 9:45 AM Medical Record Number: [id number] Patient Account Number: [id number] Date of Birth/Sex: Dec 19, 1995 (21 y.o. M) Treating RN: Huel Coventry Primary Care Romell Wolden: Jolene Provost Other Clinician: Referring Laneah Luft: Jolene Provost Treating Aili Casillas/Extender: Altamese [place] in Treatment: 0 Vital Signs Time Taken: 10:07 Temperature (F): 98.7 Height (in): 70 Pulse  (bpm): 104 Source: Stated Respiratory Rate (breaths/min): 16 Weight (lbs): 165 Blood Pressure (mmHg): 159/85 Source: Stated Reference Range: 80 - 120 mg / dl Body Mass Index (BMI): 23.7 Electronic Signature(s) Signed: 01/08/2018 7:45:54 AM By: Arnette Norris Entered By: Arnette Norris on 12/18/2017 10:09:33

## 2018-08-21 ENCOUNTER — Ambulatory Visit (INDEPENDENT_AMBULATORY_CARE_PROVIDER_SITE_OTHER): Payer: BLUE CROSS/BLUE SHIELD | Admitting: Family Medicine

## 2018-08-21 ENCOUNTER — Encounter: Payer: Self-pay | Admitting: Family Medicine

## 2018-08-21 VITALS — BP 139/78 | HR 117 | Temp 101.5°F | Resp 14

## 2018-08-21 DIAGNOSIS — R509 Fever, unspecified: Secondary | ICD-10-CM

## 2018-08-21 DIAGNOSIS — J101 Influenza due to other identified influenza virus with other respiratory manifestations: Secondary | ICD-10-CM

## 2018-08-21 DIAGNOSIS — M25551 Pain in right hip: Secondary | ICD-10-CM

## 2018-08-21 DIAGNOSIS — M79661 Pain in right lower leg: Secondary | ICD-10-CM

## 2018-08-21 LAB — POCT INFLUENZA A/B
Influenza A, POC: POSITIVE — AB
Influenza B, POC: NEGATIVE

## 2018-08-21 LAB — POCT RAPID STREP A (OFFICE): RAPID STREP A SCREEN: NEGATIVE

## 2018-08-21 MED ORDER — OSELTAMIVIR PHOSPHATE 75 MG PO CAPS: 75.0000 mg | ORAL_CAPSULE | Freq: Two times a day (BID) | ORAL | 0 refills | Status: AC

## 2018-08-22 NOTE — Progress Notes (Signed)
Patient presents today with symptoms of fever, headache, cough, nasal congestion.  Patient states that he has had symptoms for the last 2 days.  He did do athletic activity yesterday.  He denies any chest pain, shortness of breath, nausea, vomiting, diarrhea, severe headache, photophobia, neck stiffness.  Patient denies getting flu vaccination this year.  He has been taking Tylenol/ibuprofen intermittently. Patient also admits that he has had right hip pain and bilateral shin pain (R>L) for few months.  He has gotten treatments for shinsplints through the athletic training staff.  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  He denies any pain at rest or while sleeping.  He typically wears running shoes or cleats when running.  He denies any history of stress reaction or fracture in the past. Patient states his right anterior hip pain started a few months ago.  He denies any trauma or injury.  He notices the pain mostly when kicking the football and some running.  He denies any pain with walking or at rest or when sleeping.  ROS: Negative except mentioned above. Vitals as per Epic. GENERAL: NAD HEENT: mild pharyngeal erythema, no exudate, no erythema of TMs, no cervical LAD RESP: CTA B CARD: RRR MSK: Bilateral Lower Extremities - no obvious deformity, mild generalized tenderness along the mid to distal medial border of the tibia (R>L), full range of motion, normal gait, N/V intact NEURO: CN II-XII grossly intact   A/P: Influenza A - patient had positive flu test in the office today, discussed Tamiflu if patient would like to take it, rest, hydration, Delsym as needed, Tylenol/Ibuprofen as needed, seek medical attention if symptoms persist or worsen as discussed.  No athletic activity or class today or tomorrow. If patient remains afebrile over the weekend can gradually return back to physical activity on Monday. Discussed above with athletic trainer.  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  Seek medical attention for any acute problems.  Above was discussed with athletic trainer.  Right Hip Pain - unable to assess today due to limited time, will return next week for further evaluation of this, will check vitamin D level at that time, will get x-ray of right hip, activity as tolerated for now.

## 2018-08-25 ENCOUNTER — Ambulatory Visit
Admission: RE | Admit: 2018-08-25 | Discharge: 2018-08-25 | Disposition: A | Discharge status: Home / Self Care | Payer: BLUE CROSS/BLUE SHIELD | Attending: Family Medicine | Admitting: Family Medicine

## 2018-08-25 ENCOUNTER — Ambulatory Visit
Admission: RE | Admit: 2018-08-25 | Discharge: 2018-08-25 | Disposition: A | Discharge status: Home / Self Care | Payer: BLUE CROSS/BLUE SHIELD | Source: Ambulatory Visit | Attending: Family Medicine | Admitting: Family Medicine

## 2018-08-25 DIAGNOSIS — M79661 Pain in right lower leg: Secondary | ICD-10-CM

## 2018-08-25 DIAGNOSIS — M25551 Pain in right hip: Secondary | ICD-10-CM | POA: Diagnosis present

## 2018-09-02 ENCOUNTER — Ambulatory Visit (INDEPENDENT_AMBULATORY_CARE_PROVIDER_SITE_OTHER): Payer: BLUE CROSS/BLUE SHIELD | Admitting: Family Medicine

## 2018-09-02 ENCOUNTER — Other Ambulatory Visit: Payer: Self-pay | Admitting: Family Medicine

## 2018-09-02 ENCOUNTER — Encounter: Payer: Self-pay | Admitting: Family Medicine

## 2018-09-02 VITALS — BP 140/87 | HR 89 | Temp 98.1°F | Resp 14

## 2018-09-02 DIAGNOSIS — M25551 Pain in right hip: Secondary | ICD-10-CM | POA: Diagnosis not present

## 2018-09-02 DIAGNOSIS — G8929 Other chronic pain: Secondary | ICD-10-CM | POA: Diagnosis not present

## 2018-09-04 ENCOUNTER — Other Ambulatory Visit: Payer: Self-pay | Admitting: Family Medicine

## 2018-09-04 DIAGNOSIS — G8929 Other chronic pain: Secondary | ICD-10-CM

## 2018-09-04 DIAGNOSIS — M25551 Pain in right hip: Principal | ICD-10-CM

## 2018-09-08 ENCOUNTER — Ambulatory Visit: Payer: PRIVATE HEALTH INSURANCE

## 2018-09-16 ENCOUNTER — Ambulatory Visit
Admission: RE | Admit: 2018-09-16 | Discharge: 2018-09-16 | Disposition: A | Discharge status: Home / Self Care | Payer: BLUE CROSS/BLUE SHIELD | Source: Ambulatory Visit | Attending: Family Medicine | Admitting: Family Medicine

## 2018-09-16 DIAGNOSIS — M25551 Pain in right hip: Secondary | ICD-10-CM | POA: Insufficient documentation

## 2018-09-16 DIAGNOSIS — G8929 Other chronic pain: Secondary | ICD-10-CM

## 2018-09-16 MED ORDER — LIDOCAINE HCL (PF) 1 % IJ SOLN
5.0000 mL | Freq: Once | INTRAMUSCULAR | Status: AC
  Administered 2018-09-16: 5 mL
  Filled 2018-09-16: qty 5

## 2018-09-16 MED ORDER — SODIUM CHLORIDE (PF) 0.9 % IJ SOLN
10.0000 mL | Freq: Once | INTRAMUSCULAR | Status: AC
  Administered 2018-09-16: 10 mL

## 2018-09-16 MED ORDER — IOPAMIDOL (ISOVUE-300) INJECTION 61%
10.0000 mL | Freq: Once | INTRAVENOUS | Status: AC | PRN
  Administered 2018-09-16: 10 mL

## 2018-09-16 MED ORDER — GADOBUTROL 1 MMOL/ML IV SOLN
0.0500 mL | Freq: Once | INTRAVENOUS | Status: AC | PRN
  Administered 2018-09-16 (×2): 0.05 mL

## 2019-01-26 NOTE — Progress Notes (Signed)
Patient presents today for follow-up regarding his right hip pain. Patient states his right anterior hip pain started a few months ago.  His symptoms are intermittent. He denies any trauma or injury.  He notices the pain mostly when kicking the football and some running.  He denies any pain with walking or at rest or when sleeping. Xrays were done which appear normal.   ROS: Negative except mentioned above. Vitals as per Epic. GENERAL: NAD MSK: R Hip - mild tenderness along proximal hip flexor area, FROM, -Fulcrum, mildly tight hip flexors, -hop test, nv intact, normal gait NEURO: CN II-XII grossly intact   A/P: R Hip Pain - given length of symptoms will look for intra-articular etiology, will do further imaging with MR Arthrogram to evaluate for labral pathology, no activities that cause symptoms for now, NSAIDs, prn, f/u with Dr. Gerald Dexter if symptoms persist or worsen, discussed plan with AT.

## 2019-02-12 ENCOUNTER — Other Ambulatory Visit: Payer: Self-pay | Admitting: *Deleted

## 2019-02-12 DIAGNOSIS — Z20822 Contact with and (suspected) exposure to covid-19: Secondary | ICD-10-CM

## 2019-02-16 ENCOUNTER — Other Ambulatory Visit: Payer: Self-pay

## 2019-02-16 DIAGNOSIS — Z20822 Contact with and (suspected) exposure to covid-19: Secondary | ICD-10-CM

## 2019-02-21 LAB — NOVEL CORONAVIRUS, NAA: SARS-CoV-2, NAA: NOT DETECTED

## 2019-03-23 ENCOUNTER — Other Ambulatory Visit: Payer: Self-pay

## 2019-03-23 DIAGNOSIS — Z20822 Contact with and (suspected) exposure to covid-19: Secondary | ICD-10-CM

## 2019-03-25 LAB — NOVEL CORONAVIRUS, NAA: SARS-CoV-2, NAA: NOT DETECTED

## 2019-04-28 ENCOUNTER — Other Ambulatory Visit: Payer: Self-pay

## 2019-04-28 DIAGNOSIS — Z20822 Contact with and (suspected) exposure to covid-19: Secondary | ICD-10-CM

## 2019-04-29 LAB — NOVEL CORONAVIRUS, NAA: SARS-CoV-2, NAA: NOT DETECTED

## 2019-05-04 ENCOUNTER — Other Ambulatory Visit: Payer: Self-pay

## 2019-05-04 DIAGNOSIS — Z20822 Contact with and (suspected) exposure to covid-19: Secondary | ICD-10-CM

## 2019-05-05 LAB — NOVEL CORONAVIRUS, NAA: SARS-CoV-2, NAA: DETECTED — AB

## 2019-05-11 ENCOUNTER — Other Ambulatory Visit: Payer: Self-pay | Admitting: Family Medicine

## 2019-05-11 DIAGNOSIS — I400 Infective myocarditis: Secondary | ICD-10-CM

## 2019-05-11 DIAGNOSIS — Z8616 Personal history of COVID-19: Secondary | ICD-10-CM

## 2019-05-11 DIAGNOSIS — U071 COVID-19: Secondary | ICD-10-CM

## 2019-05-11 DIAGNOSIS — Z8619 Personal history of other infectious and parasitic diseases: Secondary | ICD-10-CM

## 2019-05-15 ENCOUNTER — Other Ambulatory Visit: Payer: Self-pay

## 2019-05-15 ENCOUNTER — Other Ambulatory Visit: Payer: PRIVATE HEALTH INSURANCE | Admitting: Family Medicine

## 2019-05-15 DIAGNOSIS — Z0000 Encounter for general adult medical examination without abnormal findings: Secondary | ICD-10-CM

## 2019-05-16 LAB — TROPONIN I: Troponin I: <0.01 ng/mL (ref 0.00–0.04)

## 2019-05-19 ENCOUNTER — Ambulatory Visit (INDEPENDENT_AMBULATORY_CARE_PROVIDER_SITE_OTHER): Payer: BC Managed Care – PPO

## 2019-05-19 ENCOUNTER — Other Ambulatory Visit: Payer: Self-pay

## 2019-05-19 DIAGNOSIS — Z8619 Personal history of other infectious and parasitic diseases: Secondary | ICD-10-CM

## 2019-05-19 DIAGNOSIS — I400 Infective myocarditis: Secondary | ICD-10-CM

## 2019-05-19 DIAGNOSIS — Z8616 Personal history of COVID-19: Secondary | ICD-10-CM

## 2019-05-19 DIAGNOSIS — U071 COVID-19: Secondary | ICD-10-CM

## 2019-05-24 ENCOUNTER — Telehealth: Payer: Self-pay | Admitting: Family Medicine

## 2019-05-24 NOTE — Telephone Encounter (Signed)
Called and explained to patient that Cardiology has reviewed the ECG and Echocardiogram and has not informed me that any further cardiac workup is needed for patient to be able to resume physical activity. Patient is to seek medical attention if any cardiopulmonary symptoms develop once activity is resumed. Patient addresses understanding and has no further questions or concerns.  

## 2019-05-25 ENCOUNTER — Other Ambulatory Visit: Payer: PRIVATE HEALTH INSURANCE

## 2019-11-23 IMAGING — CR DG TIBIA/FIBULA 2V*R*
1 series · 4 of 4 positions shown · non-contrast
Comparison: None.

CLINICAL DATA: Pain for several months

EXAM:
RIGHT TIBIA AND FIBULA - 2 VIEW

[Series 1: dg tibia/fibula right · 0.14mm/px · 4 of 4 slices shown]
[im 1/4]
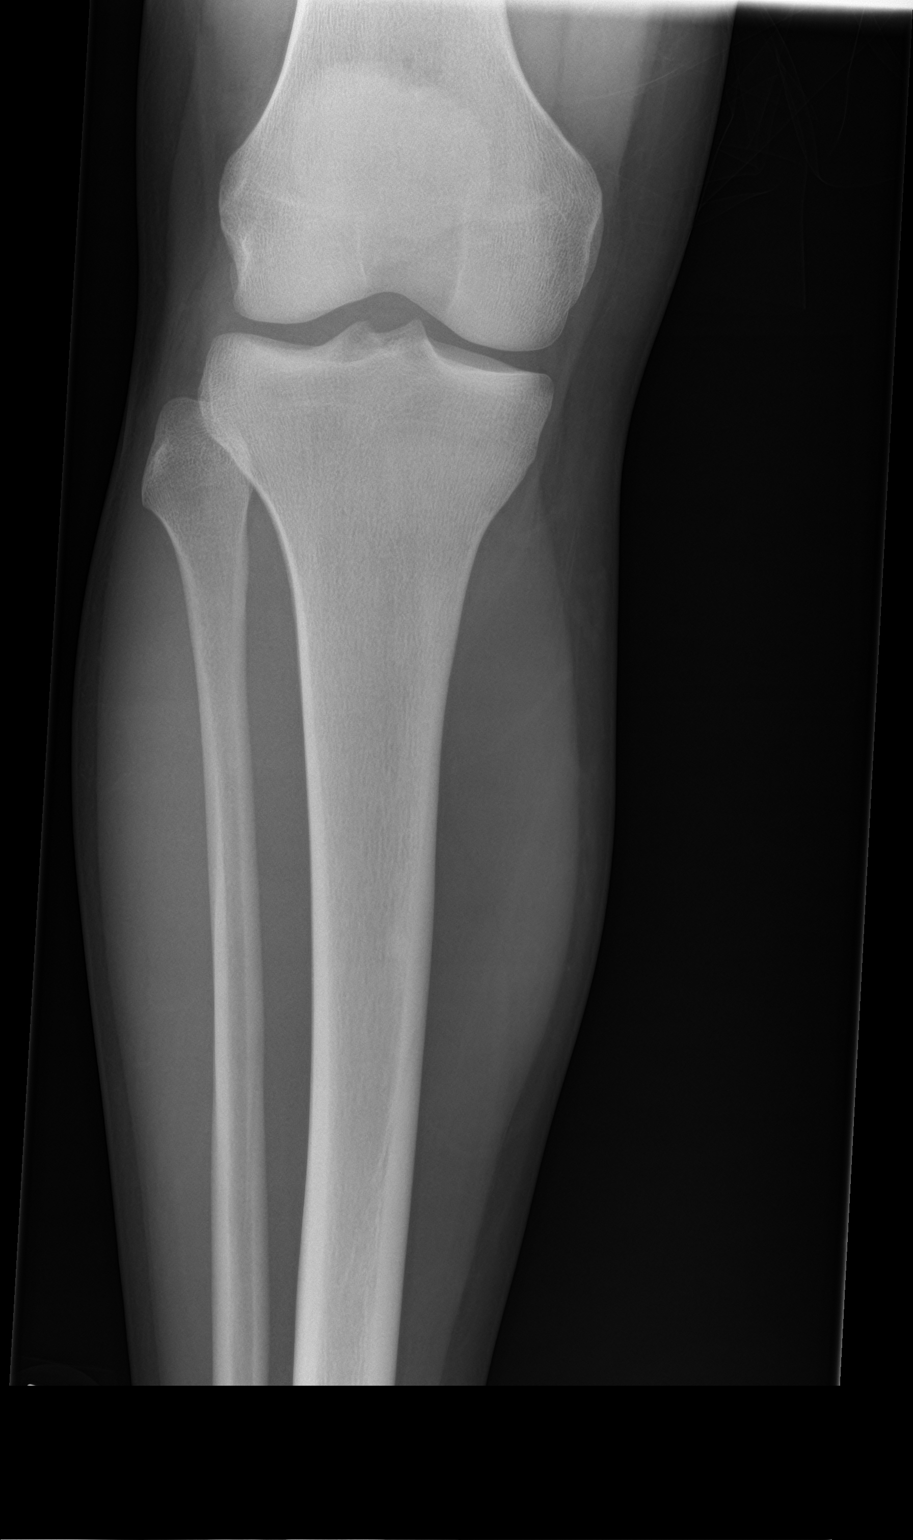
[im 2/4]
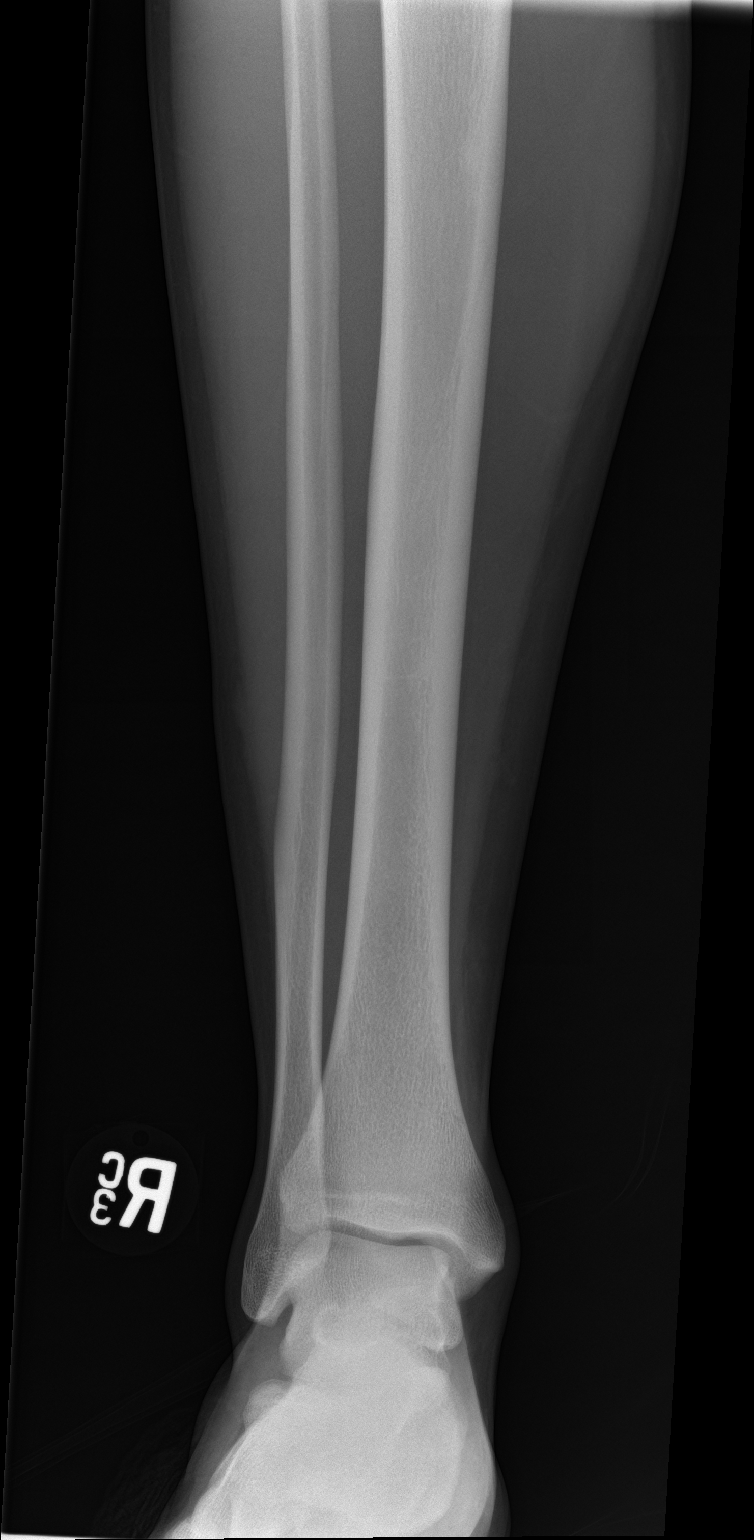
[im 3/4]
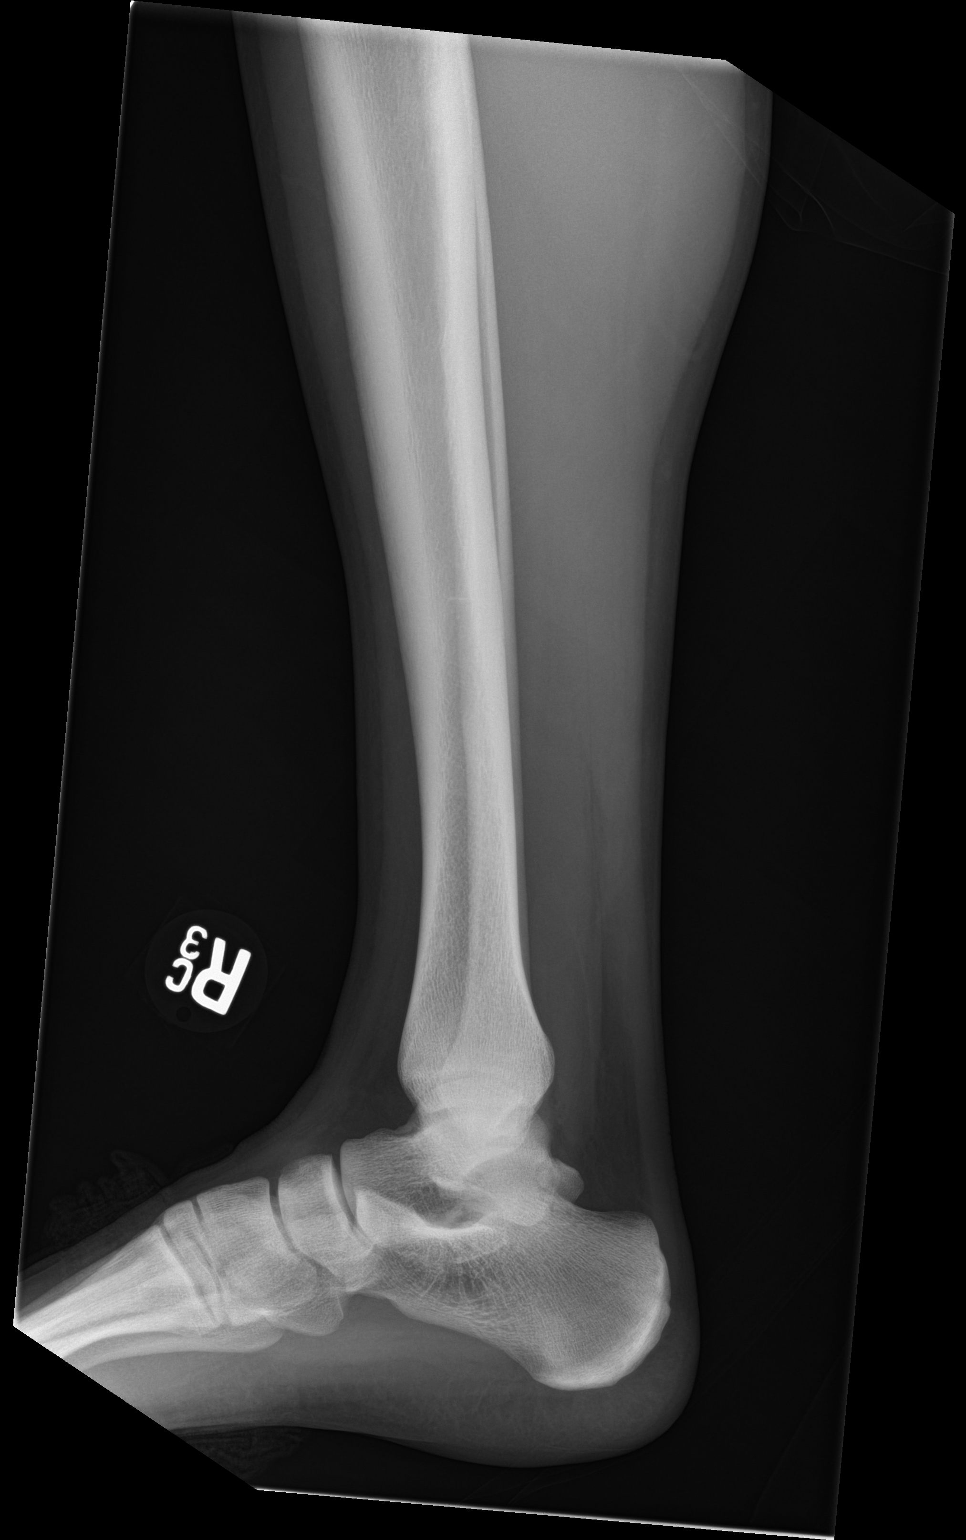
[im 4/4]
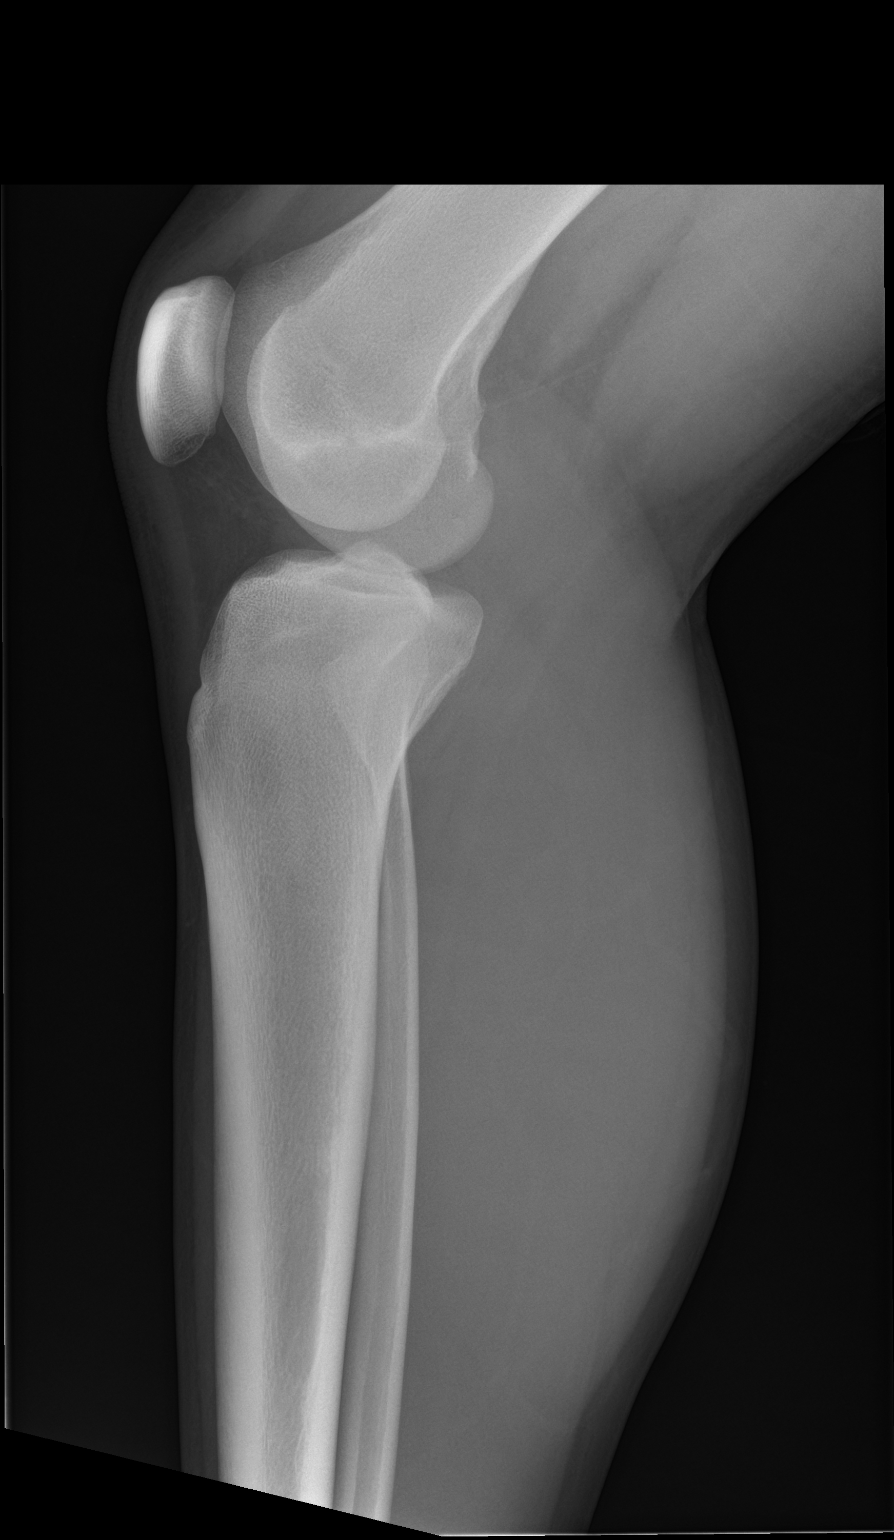

[4 of 4 positions shown; findings below may reference images not displayed]

FINDINGS: Frontal and lateral views were obtained. There is no appreciable
fracture or dislocation. No abnormal periosteal reaction. Joint
spaces appear normal. No knee or ankle joint effusion.
IMPRESSION: No fracture or dislocation.  No appreciable arthropathy.
